# Patient Record
Sex: Male | Born: 1985 | Race: White | Hispanic: No | Marital: Single | Smoking: Never smoker
Health system: Southern US, Community
[De-identification: ages and names within clinical notes are randomized; demographics above are authoritative.]

## PROBLEM LIST (undated history)

## (undated) DIAGNOSIS — F329 Major depressive disorder, single episode, unspecified: Secondary | ICD-10-CM

## (undated) DIAGNOSIS — F32A Depression, unspecified: Secondary | ICD-10-CM

---
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
## 2005-11-29 ENCOUNTER — Emergency Department (HOSPITAL_COMMUNITY): Admission: EM | Admit: 2005-11-29 | Discharge: 2005-11-29 | Payer: Self-pay | Admitting: Family Medicine

## 2005-12-04 ENCOUNTER — Emergency Department (HOSPITAL_COMMUNITY): Admission: EM | Admit: 2005-12-04 | Discharge: 2005-12-04 | Payer: Self-pay | Admitting: Family Medicine

## 2005-12-12 ENCOUNTER — Emergency Department (HOSPITAL_COMMUNITY): Admission: EM | Admit: 2005-12-12 | Discharge: 2005-12-12 | Payer: Self-pay | Admitting: Family Medicine
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
## 2007-10-22 ENCOUNTER — Emergency Department (HOSPITAL_COMMUNITY): Admission: EM | Admit: 2007-10-22 | Discharge: 2007-10-22 | Payer: Self-pay | Admitting: Family Medicine
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
## 2011-05-26 LAB — INFLUENZA A AND B ANTIGEN (CONVERTED LAB)

## 2016-01-31 ENCOUNTER — Emergency Department (HOSPITAL_COMMUNITY)
Admission: EM | Admit: 2016-01-31 | Discharge: 2016-01-31 | Disposition: A | Discharge status: Home / Self Care | Payer: BLUE CROSS/BLUE SHIELD | Attending: Emergency Medicine | Admitting: Emergency Medicine

## 2016-01-31 ENCOUNTER — Encounter (HOSPITAL_COMMUNITY): Payer: Self-pay | Admitting: *Deleted

## 2016-01-31 DIAGNOSIS — F329 Major depressive disorder, single episode, unspecified: Secondary | ICD-10-CM | POA: Insufficient documentation

## 2016-01-31 DIAGNOSIS — F32A Depression, unspecified: Secondary | ICD-10-CM

## 2016-01-31 HISTORY — DX: Major depressive disorder, single episode, unspecified: F32.9

## 2016-01-31 HISTORY — DX: Depression, unspecified: F32.A

## 2016-01-31 LAB — TSH: TSH: 0.841 u[IU]/mL (ref 0.350–4.500)

## 2016-01-31 NOTE — ED Provider Notes (Signed)
  History  By signing my name below, I, [name], attest that this documentation has been prepared under the direction and in the presence of [name], [place]-C. Electronically Signed: [name], ED Scribe. 01/31/2016. 6:00 PM.  No chief complaint on file.  The history is provided by the patient and medical records. No language interpreter was used.    HPI Comments:  Dillon Russell is a 30 y.o. male who presents to the Emergency Department wanting thyroid labs drawn. He states he wants to get the results to his psychiatrist because he has been experiencing increased depression. 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 He denies any symptoms. He denies palpitations, nodules in the neck, leg swelling, heat or cold intolerance, fever, chills, nausea, vomiting, SI or HI. He does not have a PCP.   Past Medical History  Diagnosis Date  . Depression    History reviewed. No pertinent past surgical history. No family history on file. Social History  Substance Use Topics  . Smoking status: Never Smoker   . Smokeless tobacco: None  . Alcohol Use: Yes     Comment: 4-5x/week    Review of Systems  Constitutional: Negative for fever and chills.  Cardiovascular: Negative for palpitations.  Gastrointestinal: Negative for nausea and vomiting.  Hematological: Negative for adenopathy. Does not bruise/bleed easily.  Psychiatric/Behavioral: Negative for suicidal ideas.       Chronic depression  All other systems reviewed and are negative.   Allergies  Review of patient's allergies indicates no known allergies.  Home Medications   Prior to Admission medications   Not on File  Triage Vitals: BP 154/115 mmHg  Pulse 95  Temp(Src) 98.2 F (36.8 C) (Oral)  Resp 20  Ht 5\' 8"  (1.727 m)  SpO2 100% Physical Exam  Constitutional: He is oriented to person, place, and time. He appears well-developed and well-nourished.  HENT:  Head:  Normocephalic and atraumatic.  Eyes: EOM are normal.  Neck: Normal range of motion.  Cardiovascular: Normal rate.   Pulmonary/Chest: Effort normal.  Musculoskeletal: Normal range of motion.  Neurological: He is alert and oriented to person, place, and time.  Skin: Skin is warm and dry.  Psychiatric: He has a normal mood and affect. His behavior is normal.  Nursing note and vitals reviewed.   ED Course  Procedures (including critical care time) DIAGNOSTIC STUDIES: Oxygen Saturation is 100% on RA, normal by my interpretation.   COORDINATION OF CARE: 5:56 PM- Will order TSH level. Explained to pt that result would not come back today. Pt verbalizes understanding and agrees to plan.  Medications - No data to display  Labs Review Labs Reviewed  TSH    Imaging Review No results found. I have personally reviewed and evaluated these images and lab results as part of my medical decision-making.   EKG Interpretation None      MDM    Final diagnoses:  Depression      An After Visit Summary was printed and given to the patient.  Elson AreasLeslie K Sofia, PA-C 01/31/16 1907  Lonia SkinnerLeslie K WindsorSofia, PA-C 01/31/16 1909  Alvira MondayErin Schlossman, MD 02/02/16 1314

## 2016-01-31 NOTE — ED Notes (Signed)
Patient states that he has had si before and his depression has been really bad recently so he is seeing his psychiatrist tomorrow but does not have active si now and he is staying with a friend until he sees his doctor tomorrow

## 2016-01-31 NOTE — Discharge Instructions (Signed)
Major Depressive Disorder Major depressive disorder is a mental illness. It also may be called clinical depression or unipolar depression. Major depressive disorder usually causes feelings of sadness, hopelessness, or helplessness. Some people with this disorder do not feel particularly sad but lose interest in doing things they used to enjoy (anhedonia). Major depressive disorder also can cause physical symptoms. It can interfere with work, school, relationships, and other normal everyday activities. The disorder varies in severity but is longer lasting and more serious than the sadness we all feel from time to time in our lives. Major depressive disorder often is triggered by stressful life events or major life changes. Examples of these triggers include divorce, loss of your job or home, a move, and the death of a family member or close friend. Sometimes this disorder occurs for no obvious reason at all. People who have family members with major depressive disorder or bipolar disorder are at higher risk for developing this disorder, with or without life stressors. Major depressive disorder can occur at any age. It may occur just once in your life (single episode major depressive disorder). It may occur multiple times (recurrent major depressive disorder). SYMPTOMS People with major depressive disorder have either anhedonia or depressed mood on nearly a daily basis for at least 2 weeks or longer. Symptoms of depressed mood include:  Feelings of sadness (blue or down in the dumps) or emptiness.  Feelings of hopelessness or helplessness.  Tearfulness or episodes of crying (may be observed by others).  Irritability (children and adolescents). In addition to depressed mood or anhedonia or both, people with this disorder have at least four of the following symptoms:  Difficulty sleeping or sleeping too much.   Significant change (increase or decrease) in appetite or weight.   Lack of energy or  motivation.  Feelings of guilt and worthlessness.   Difficulty concentrating, remembering, or making decisions.  Unusually slow movement (psychomotor retardation) or restlessness (as observed by others).   Recurrent wishes for death, recurrent thoughts of self-harm (suicide), or a suicide attempt. People with major depressive disorder commonly have persistent negative thoughts about themselves, other people, and the world. People with severe major depressive disorder may experiencedistorted beliefs or perceptions about the world (psychotic delusions). They also may see or hear things that are not real (psychotic hallucinations). DIAGNOSIS Major depressive disorder is diagnosed through an assessment by your health care provider. Your health care provider will ask aboutaspects of your daily life, such as mood,sleep, and appetite, to see if you have the diagnostic symptoms of major depressive disorder. Your health care provider may ask about your medical history and use of alcohol or drugs, including prescription medicines. Your health care provider also may do a physical exam and blood work. This is because certain medical conditions and the use of certain substances can cause major depressive disorder-like symptoms (secondary depression). Your health care provider also may refer you to a mental health specialist for further evaluation and treatment. TREATMENT It is important to recognize the symptoms of major depressive disorder and seek treatment. The following treatments can be prescribed for this disorder:   Medicine. Antidepressant medicines usually are prescribed. Antidepressant medicines are thought to correct chemical imbalances in the brain that are commonly associated with major depressive disorder. Other types of medicine may be added if the symptoms do not respond to antidepressant medicines alone or if psychotic delusions or hallucinations occur.  Talk therapy. Talk therapy can be  helpful in treating major depressive disorder by providing   support, education, and guidance. Certain types of talk therapy also can help with negative thinking (cognitive behavioral therapy) and with relationship issues that trigger this disorder (interpersonal therapy). A mental health specialist can help determine which treatment is best for you. Most people with major depressive disorder do well with a combination of medicine and talk therapy. Treatments involving electrical stimulation of the brain can be used in situations with extremely severe symptoms or when medicine and talk therapy do not work over time. These treatments include electroconvulsive therapy, transcranial magnetic stimulation, and vagal nerve stimulation.   This information is not intended to replace advice given to you by your health care provider. Make sure you discuss any questions you have with your health care provider.   Document Released: 12/16/2012 Document Revised: 09/11/2014 Document Reviewed: 12/16/2012 [employer] Interactive Patient Education 2016 [employer]  

## 2016-01-31 NOTE — ED Notes (Signed)
Pt states that he has an appointment with his psychiatrist tomorrow and needs his thyroid checked prior to the appoinment tomorrow so they will know if he needs medications. States he is leaving town which is why he would like the labs done today. States he has been depressed but does not currently feel suicidal.
